# Patient Record
Sex: Female | Born: 1985 | Race: White | Hispanic: No | Marital: Single | Smoking: Never smoker
Health system: Northeastern US, Community
[De-identification: ages and names within clinical notes are randomized; demographics above are authoritative.]

## PROBLEM LIST (undated history)

## (undated) DIAGNOSIS — R7881 Bacteremia: Secondary | ICD-10-CM

## (undated) HISTORY — PX: NO SIGNIFICANT SURGICAL HISTORY: 1000005

## (undated) HISTORY — DX: Bacteremia: R78.81

## (undated) NOTE — Progress Notes (Signed)
 Associated Order(s): Foreign Body Removal - Embedded  Post-Procedure Diagnose(s): Splinter of elbow without major open wound or infection, right, initial encounter  Formatting of this note might be different from the original.  Subjective   Patient ID: Belinda Walker is a 75 y.o. female presenting to the Urgent Care with a chief complaint of Foreign Body in Skin (Splinter in right elbow x2 months- hasn't been able to find a place to remove it like PCP or derm).    HPI  Patient states that she was taking off a jacket with built-in support and she got a splinter/sliver of whatever the supporting material is made out of in her right, inner elbow area. The splinter has not tried to work its way out. She presents for splinter removal.     Objective   BP 141/80 (BP Location: Left arm, Patient Position: Sitting, BP Cuff Size: Adult)   Pulse 78   Temp 36.3 C (97.3 F) (Temporal)   Resp 18   Ht 1.753 m (5' 9)   Wt 56.7 kg (125 lb)   SpO2 99%   BMI 18.46 kg/m     Physical Exam  Constitutional:       General: She is not in acute distress.     Appearance: She is not ill-appearing or toxic-appearing.   Pulmonary:      Effort: Pulmonary effort is normal. No respiratory distress.   Skin:     Comments: Right, inner elbow area:   + superficial splinter visible. No overlying erythema. No bleeding or drainage.   AROM of the RUE. Neurovascular status is intact to the RUE.    Neurological:      Mental Status: She is alert.     Foreign Body Removal - Embedded    Date/Time: 06/17/2024 2:10 PM    Performed by: Janelle Leann Corcoran, CRNP  Authorized by: Janelle Leann Corcoran, CRNP    Consent:     Consent obtained:  Verbal    Consent given by:  Patient    Risks discussed:  Infection, bleeding and pain  Universal protocol:     Procedure explained and questions answered to patient or proxy's satisfaction: yes      Patient identity confirmed:  Verbally with patient  Location:     Location:  Arm    Arm location:  R elbow    Depth:   Intradermal    Tendon involvement:  None  Anesthesia:     Anesthesia method:  Local infiltration    Local anesthetic:  Lidocaine 1% w/o epi  Procedure type:     Procedure complexity:  Simple  Procedure details:     Incision length:  3mm    Removal mechanism:  Forceps    Foreign bodies recovered:  1    Description:  Sliver of metal    Intact foreign body removal: yes    Post-procedure details:     Neurovascular status: intact      Confirmation:  No additional foreign bodies on visualization    Skin closure:  None    Dressing:  Antibiotic ointment and adhesive bandage    Procedure completion:  Tolerated well, no immediate complications    Assessment & Plan    Assessment & Plan  Splinter of elbow without major open wound or infection, right, initial encounter  Successfully removed here today   Other orders    Foreign Body Removal - Embedded    Monitor your splinter removal site for redness, increased pain, swelling, red streaking, or pus-like  drainage. Also monitor for a fever and seek further medical care if any of this presents.     Patient was advised to wash the splinter removal site with soap and water and then to apply otc topical antibiotic and Band Aid for a few days.   Electronically signed by Hart Arvin Lily, CRNP at 06/17/2024  2:03 PM EDT

---

## 2019-09-20 ENCOUNTER — Ambulatory Visit (HOSPITAL_BASED_OUTPATIENT_CLINIC_OR_DEPARTMENT_OTHER): Payer: PRIVATE HEALTH INSURANCE

## 2019-09-20 ENCOUNTER — Other Ambulatory Visit: Payer: Self-pay

## 2019-09-21 ENCOUNTER — Encounter (HOSPITAL_BASED_OUTPATIENT_CLINIC_OR_DEPARTMENT_OTHER): Payer: PRIVATE HEALTH INSURANCE

## 2019-09-27 ENCOUNTER — Encounter (HOSPITAL_BASED_OUTPATIENT_CLINIC_OR_DEPARTMENT_OTHER): Payer: Self-pay | Admitting: Family Medicine

## 2019-09-27 ENCOUNTER — Ambulatory Visit: Payer: PRIVATE HEALTH INSURANCE | Attending: Family Medicine | Admitting: Family Medicine

## 2019-09-27 ENCOUNTER — Other Ambulatory Visit: Payer: Self-pay

## 2019-09-27 DIAGNOSIS — K589 Irritable bowel syndrome without diarrhea: Secondary | ICD-10-CM | POA: Insufficient documentation

## 2019-09-27 DIAGNOSIS — Z0000 Encounter for general adult medical examination without abnormal findings: Secondary | ICD-10-CM | POA: Diagnosis present

## 2019-09-27 DIAGNOSIS — K588 Other irritable bowel syndrome: Secondary | ICD-10-CM | POA: Diagnosis not present

## 2019-09-27 DIAGNOSIS — Z975 Presence of (intrauterine) contraceptive device: Secondary | ICD-10-CM | POA: Diagnosis not present

## 2019-09-27 NOTE — Progress Notes (Signed)
Belinda Walker is a 33 year old female here for routine physical.    Problem List        Unprioritized    Irritable bowel syndrome     09/27/2019: dad has UC, she has a sensitive stomach, she has managed her IBS with diet changes  - stomach virus, then a year of stomach pain "awful pain" - FODMAP diet, garlic and onions affect her, lactose, wheat, with a rotational diet - knows her limits for every food type.          IUD (intrauterine device) in place     09/27/2019: Mirena placed in [place] 2017.                Patient Active Problem List:     IUD (intrauterine device) in place     Irritable bowel syndrome        REVIEW OF SYSTEMS: otherwise feels well, no HA, no CP, no SOB, no cough, no abdominal pain, no diarrhea, no constipation, no fevers, no chills, no significant increase or decreased in weight, no joint pain, no urinary symptoms, no dizziness, no blood in stool    No current outpatient medications on file prior to visit.  No current facility-administered medications on file prior to visit.       Review of Patient's Allergies indicates:  Allergies not on file    Past Medical History:  No date: Bacteremia      Comment:  hospitalized for this (from skin infection scratching                open sore) age 61    Past Surgical History:  No date: NO SIGNIFICANT SURGICAL HISTORY    Review of patient's family history indicates:  Problem: OTHER      Relation: Father          Age of Onset: (Not Specified)          Comment: ulcerative colitis  Problem: Depression      Relation: Mother          Age of Onset: (Not Specified)  Problem: No Known Problems      Relation: Maternal Grandmother          Age of Onset: (Not Specified)  Problem: OTHER      Relation: Maternal Grandfather          Age of Onset: (Not Specified)          Comment: melanoma  Problem: Heart Disease      Relation: Maternal Grandfather          Age of Onset: (Not Specified)  Problem: No Known Problems      Relation: Paternal Grandmother          Age of Onset:  (Not Specified)  Problem: OTHER      Relation: Paternal Grandfather          Age of Onset: (Not Specified)          Comment: prostate cancer      Social History     Socioeconomic History    Marital status: Single     Spouse name: Not on file    Number of children: Not on file    Years of education: Not on file    Highest education level: Not on file   Occupational History    Not on file   Social Needs    Financial resource strain: Not on file    Food insecurity  Worry: Not on file     Inability: Not on file    Transportation needs     Medical: Not on file     Non-medical: Not on file   Tobacco Use    Smoking status: Never Smoker    Smokeless tobacco: Never Used   Substance and Sexual Activity    Alcohol use: Yes     Comment: 1 glass of wine/month    Drug use: Never    Sexual activity: Yes     Partners: Male     Birth control/protection: I.U.D.     Comment: 6 partners   Lifestyle    Physical activity     Days per week: Not on file     Minutes per session: Not on file    Stress: Not on file   Relationships    Social connections     Talks on phone: Not on file     Gets together: Not on file     Attends religious service: Not on file     Active member of club or organization: Not on file     Attends meetings of clubs or organizations: Not on file     Relationship status: Not on file    Intimate partner violence     Fear of current or ex partner: Not on file     Emotionally abused: Not on file     Physically abused: Not on file     Forced sexual activity: Not on file   Other Topics Concern    Not on file   Social History Narrative    09/27/2019: Grew up in [place]. 1 biological brother, 2 half-siblings.    Studied music (intention) but then did biochem, then engineering in grad school, currently unemployed but interviewing for marketing.     Single. Lots of family here, attached to the [employer].     Lives with family, living in [place].          There were no vitals taken for this visit.  Gen- NAD,  appears comfortable, video visit      ASSESSMENT/PLAN:     Belinda Walker is a 33 year old female here for routine physical and to discuss:    Problem List Items Addressed This Visit        Unprioritized    Irritable bowel syndrome    IUD (intrauterine device) in place      Other Visit Diagnoses     Wellness examination    -  Primary    Relevant Orders    HIV ANTIGEN ANTIBODY 5TH GEN    CHLAMYDIA GC NAAT    RPR    LIPID PANEL    CBC WITH PLATELET        - will come in for pap smear      Well woman. Allergies and medications reviewed.   1) preventive care  --cervical cancer screening: UPTD  -- Immunizations: UPTD  - counseled on exercise, nutrition, anxiety/depression, discussed contraception    We discussed medicationsand the importance of medication compliance. The patient was ready to learn and no apparent learning barriers were identified. I explained the diagnosis and treatment plan, and the patient expressed understanding of the content. Possible side effects of the prescribed medication(s) were explained. I attempted to answer any questions regarding the diagnosis and the proposed treatment.      *Patient expressed understanding and agreement with plan  *Follow up prn  *AVS given    Belinda Walker  Belinda Julian, MD

## 2019-10-06 ENCOUNTER — Ambulatory Visit (HOSPITAL_BASED_OUTPATIENT_CLINIC_OR_DEPARTMENT_OTHER): Payer: Self-pay | Admitting: Family Medicine

## 2019-10-11 ENCOUNTER — Ambulatory Visit (HOSPITAL_BASED_OUTPATIENT_CLINIC_OR_DEPARTMENT_OTHER): Payer: Self-pay | Admitting: Physician Assistant

## 2021-12-26 IMAGING — MR MRI ANKLE RT WO CONTRAST
8 of 9 series · 39 of 40 positions shown · non-contrast
Comparison: none

﻿

Pertinent Hx:  Pain.  Tarsal tunnel mass.
TECHNIQUE: Images were taken in axial, coronal and sagittal planes.  T1 and T2-weighted imaging was performed.

[Series 3: PD · axial · 4.0mm · 0.62mm/px · z∈[-98,+87]mm · 7 of 38 slices shown]
[im 1/38]
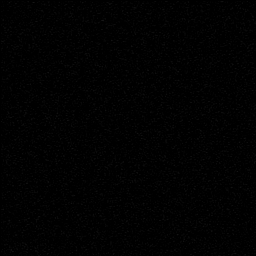
[im 7/38]
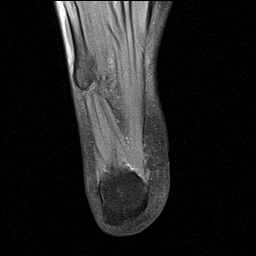
[im 13/38]
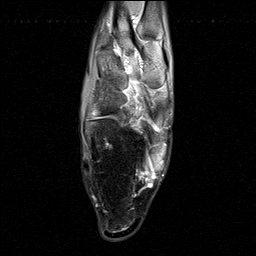
[im 19/38]
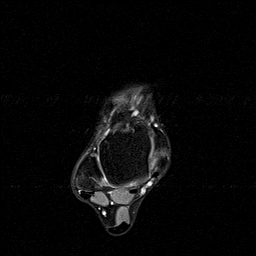
[im 25/38]
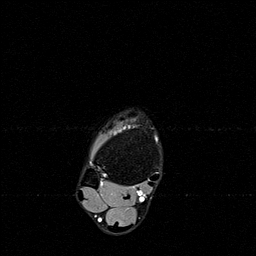
[im 31/38]
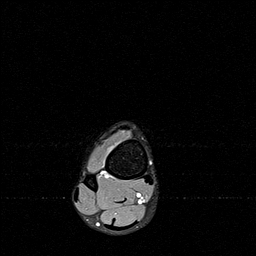
[im 38/38]
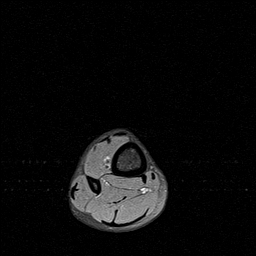

[Series 4: T2 · axial · 4.0mm · 0.62mm/px · z∈[-97,+88]mm · 7 of 38 slices shown (1 of 3)]
[im 1/38]
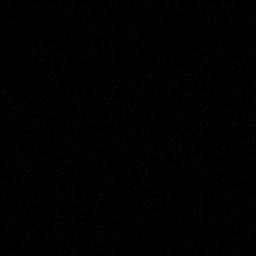
[im 7/38]
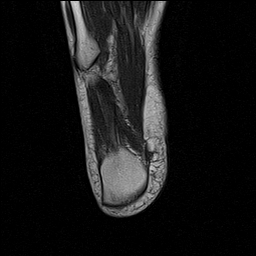
[im 13/38]
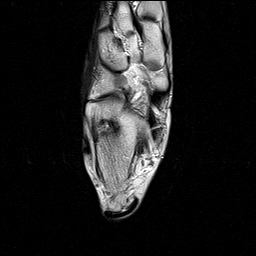
[im 19/38]
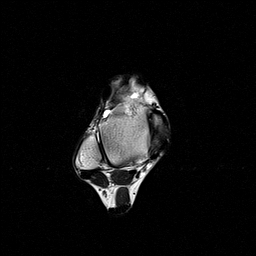
[im 25/38]
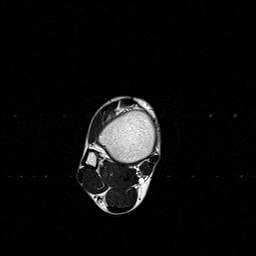
[im 31/38]
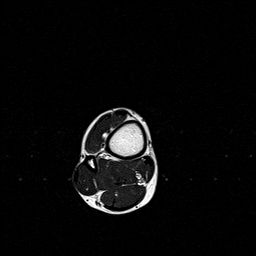
[im 38/38]
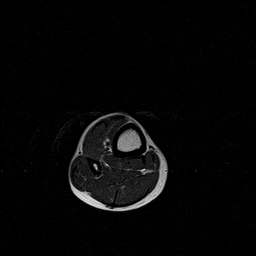

[Series 5: T1 · axial · 4.0mm · 0.62mm/px · z∈[-98,+87]mm · 7 of 38 slices shown (1 of 2)]
[im 1/38]
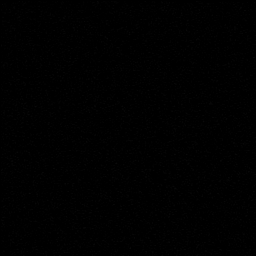
[im 7/38]
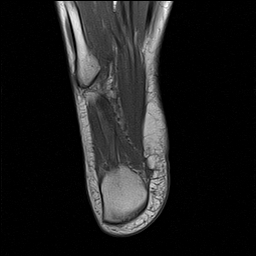
[im 13/38]
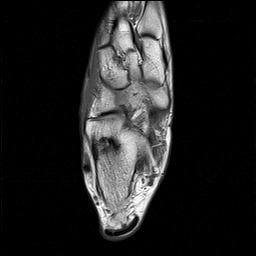
[im 19/38]
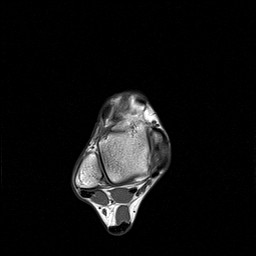
[im 25/38]
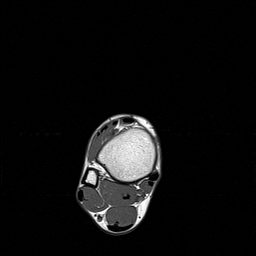
[im 31/38]
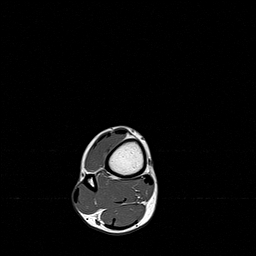
[im 38/38]
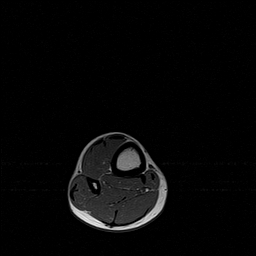

[Series 6: PD fat-sat · sagittal · 3.0mm · 0.50mm/px · 4 of 21 slices shown (1 of 2)]
[im 1/21]
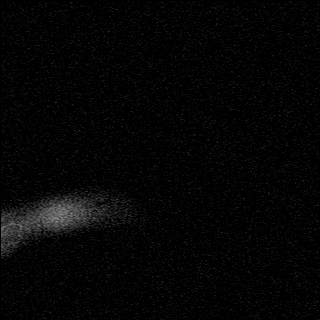
[im 7/21]
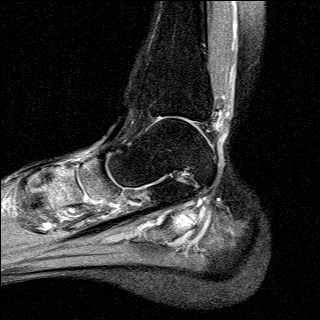
[im 14/21]
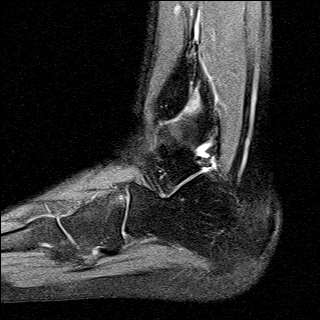
[im 21/21]
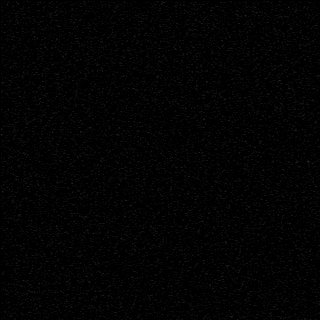

[Series 7: T2 · sagittal · 3.0mm · 0.50mm/px · 4 of 21 slices shown (2 of 3)]
[im 1/21]
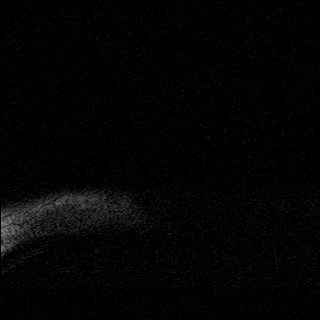
[im 7/21]
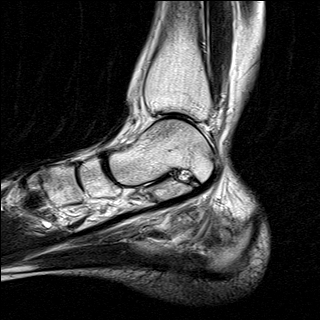
[im 14/21]
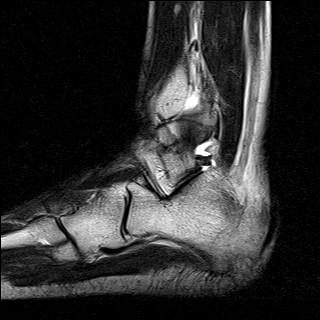
[im 21/21]
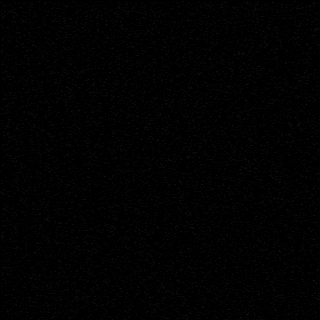

[Series 8: PD fat-sat · coronal · 4.0mm · 0.50mm/px · 4 of 24 slices shown (2 of 2)]
[im 1/24]
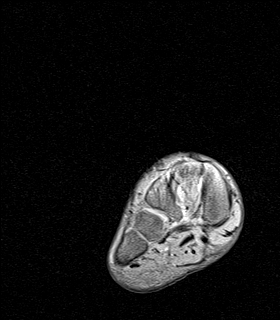
[im 8/24]
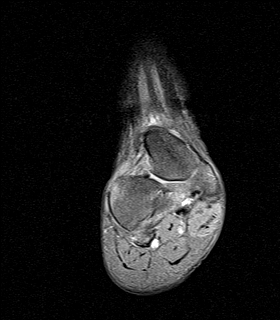
[im 16/24]
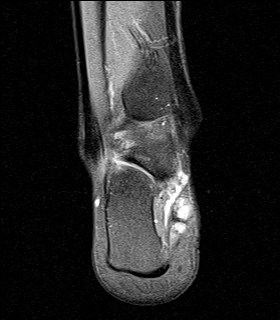
[im 24/24]
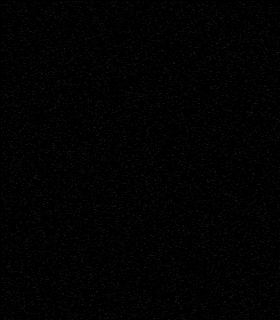

[Series 10: T2 · coronal · 4.0mm · 0.62mm/px · 4 of 24 slices shown (3 of 3)]
[im 1/24]
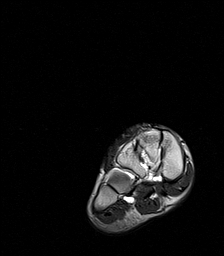
[im 8/24]
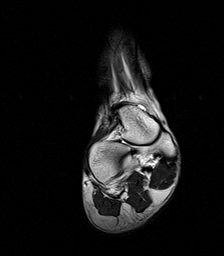
[im 16/24]
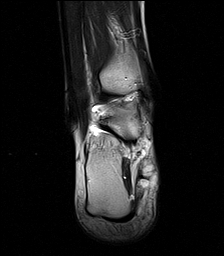
[im 24/24]
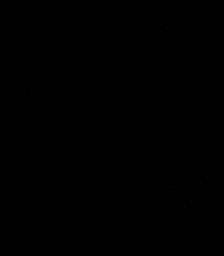

[Series 11: T1 · sagittal · 3.0mm · 0.62mm/px · 2 of 13 slices shown (2 of 2)]
[im 1/13]
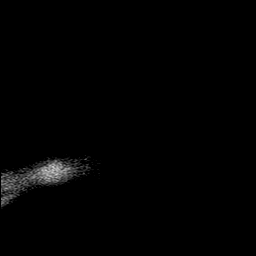
[im 13/13]
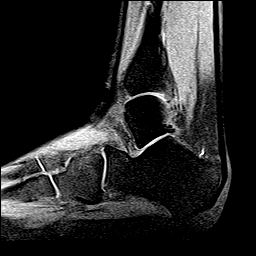

[39 of 40 positions shown; findings below may reference images not displayed]

FINDINGS: There is a mass to the medial side of the calcaneus in the tarsal tunnel.  The mass measures 1.8 x 1.8 x 2 cm.  signal intensity suggests that this is likely fluid-filled although there are a few areas within the mass that are not clearly cystic and fluid.  Overall, this is likely a ganglion cyst.  However, given that there may be some solid tissue present within this, other etiologies and particularly a neurogenic tumor such as schwannoma cannot be excluded as a possibility.  Further imaging will be recommended to narrow the differential diagnosis.  

Bones appear normal.  No evidence of stress injury or stress reaction.  There is no fracture.  

No ligament tear identified.  The anterior talofibular, fibulocalcaneal, and posterior talofibular ligaments are normal.  The deltoid ligament is normal.  The syndesmosis is normal.  Syndesmotic ligaments are normal.  

The Achilles tendon and plantar fascia are normal.

There is normal alignment between the hindfoot and the midfoot and between the midfoot and the forefoot.  

The Lisfranc ligament is normal.  

No tendon tear identified.  Specifically, the posterior tibial tendon appears normal and not torn. 

No other abnormality identified.
IMPRESSION: 1. Mass within the tarsal tunnel measuring 1.8 x 1.8 x 2 cm in size.  This is probably a ganglion cyst although there are some potentially solid areas within this.  Therefore, other etiologies cannot be entirely excluded as a possibility.  Repeat imaging of the ankle with intravenous contrast enhancement is recommended to assess further and to help narrow the differential diagnosis.

2. No other abnormality identified.

## 2022-01-11 IMAGING — MR MRI ANKLE RT W WO CONTRAST
9 of 11 series · 35 of 40 positions shown · IV contrast (prohance)
Comparison: none

﻿Pertinent Hx:  Previously seen mass in the tarsal tunnel.  Evaluate whether this is cystic or solid.
TECHNIQUE: Images were taken in axial, coronal and sagittal planes.  T1 and T2-weighted imaging was performed.  Intravenous contrast material was given, 10 mL Prohance.  Enhanced images were taken in all three planes.

[Series 3: PD · axial · 4.0mm · 0.62mm/px · z∈[-123,+61]mm · 5 of 38 slices shown]
[im 1/38]
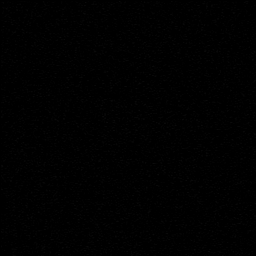
[im 10/38]
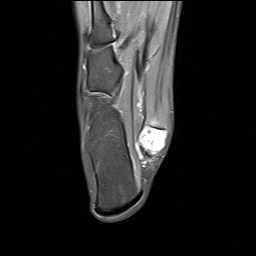
[im 19/38]
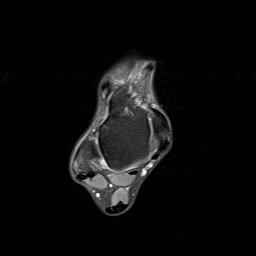
[im 28/38]
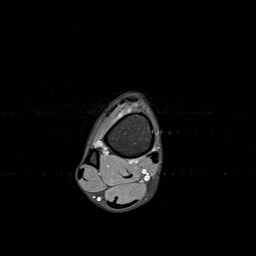
[im 38/38]
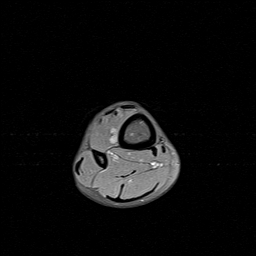

[Series 4: T2 · axial · 4.0mm · 0.50mm/px · z∈[-123,+61]mm · 5 of 38 slices shown (1 of 2)]
[im 1/38]
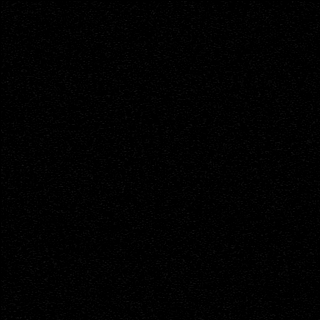
[im 10/38]
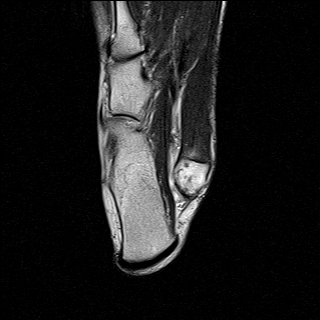
[im 19/38]
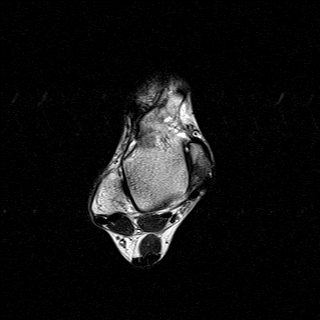
[im 28/38]
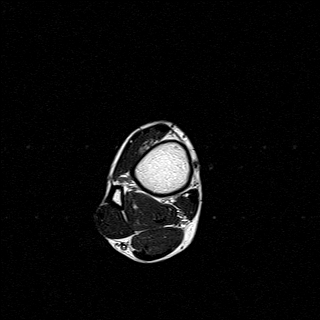
[im 38/38]
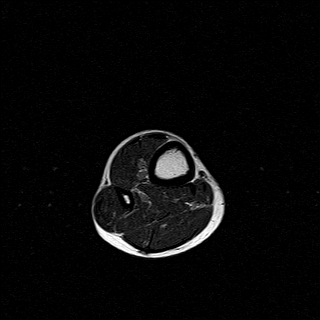

[Series 5: T1 · axial · 4.0mm · 0.62mm/px · z∈[-123,+61]mm · 5 of 38 slices shown]
[im 1/38]
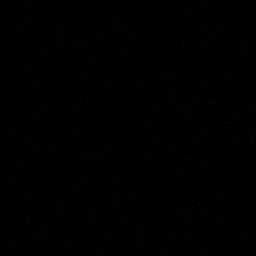
[im 10/38]
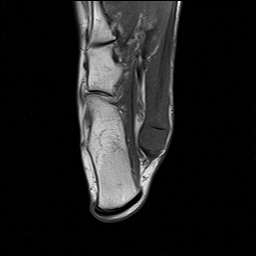
[im 19/38]
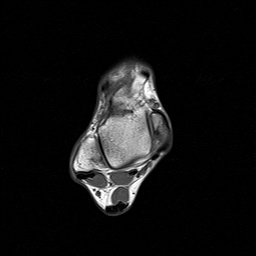
[im 28/38]
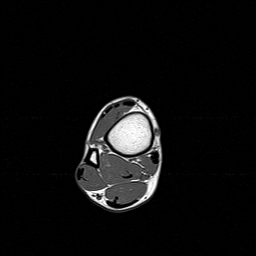
[im 38/38]
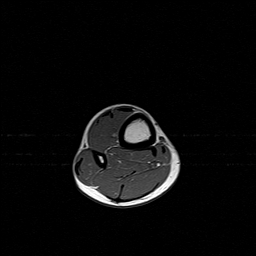

[Series 6: PD fat-sat · sagittal · 3.0mm · 0.50mm/px · 3 of 21 slices shown (1 of 2)]
[im 1/21]
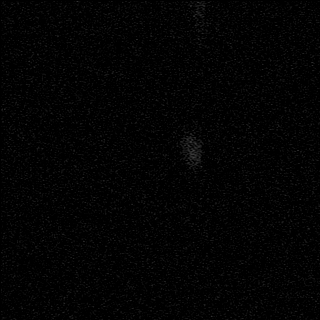
[im 11/21]
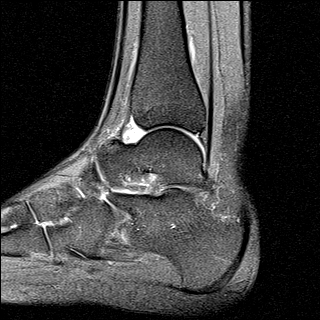
[im 21/21]
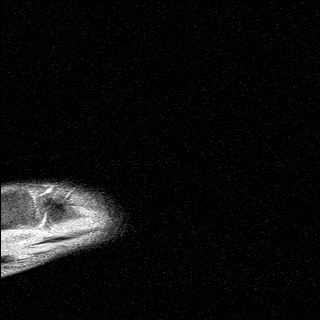

[Series 7: T2 · sagittal · 3.0mm · 0.50mm/px · 3 of 21 slices shown (2 of 2)]
[im 1/21]
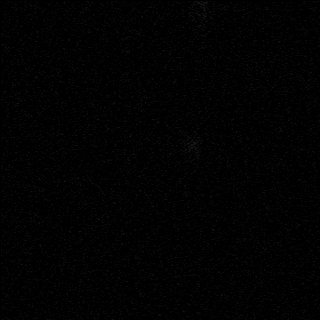
[im 11/21]
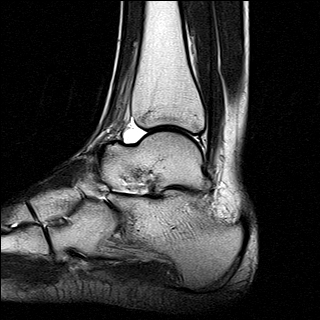
[im 21/21]
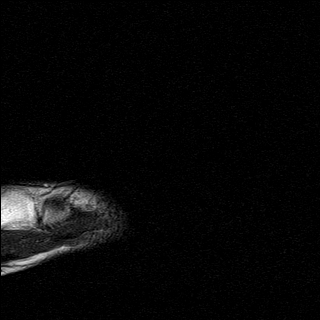

[Series 8: PD fat-sat · coronal · 4.0mm · 0.50mm/px · 3 of 24 slices shown (2 of 2)]
[im 1/24]
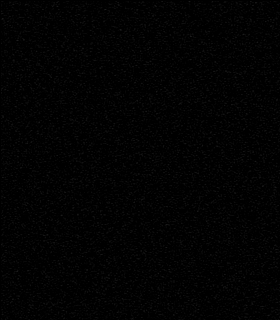
[im 12/24]
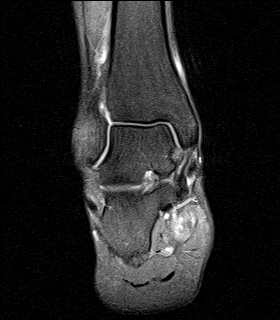
[im 24/24]
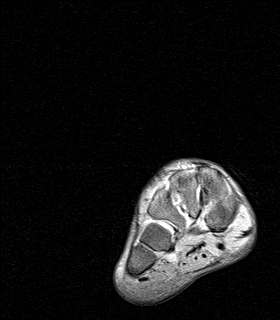

[Series 10: T1 fat-sat post-contrast · coronal · 4.0mm · 0.31mm/px · 3 of 24 slices shown (1 of 3)]
[im 1/24]
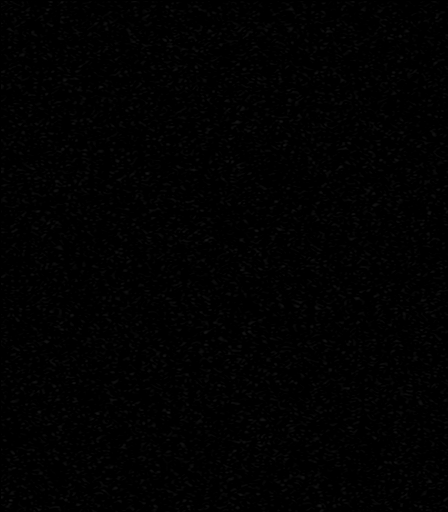
[im 12/24]
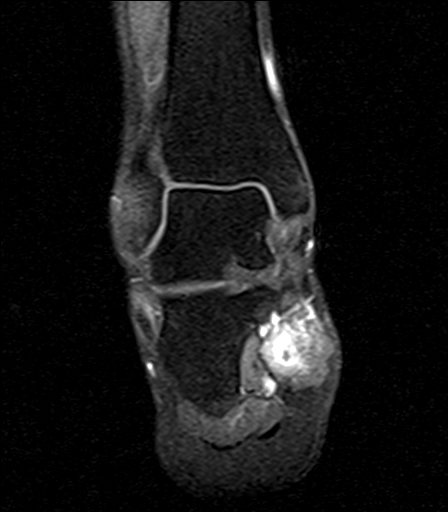
[im 24/24]
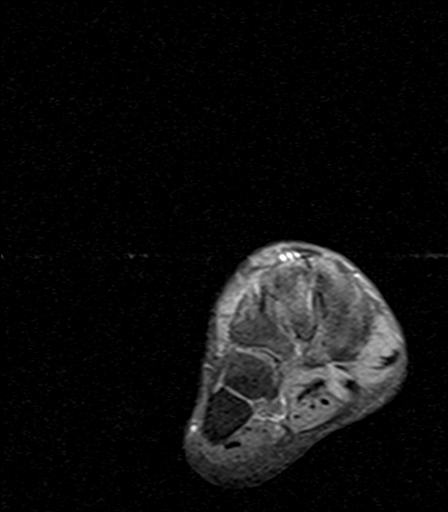

[Series 11: T1 fat-sat post-contrast · axial · 4.0mm · 0.31mm/px · z∈[-123,+61]mm · 5 of 38 slices shown (2 of 3)]
[im 1/38]
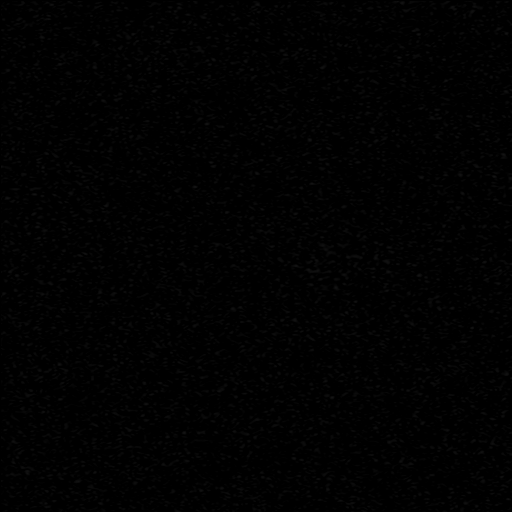
[im 10/38]
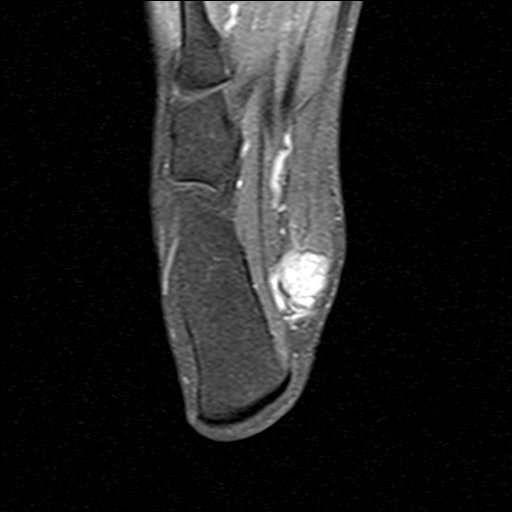
[im 19/38]
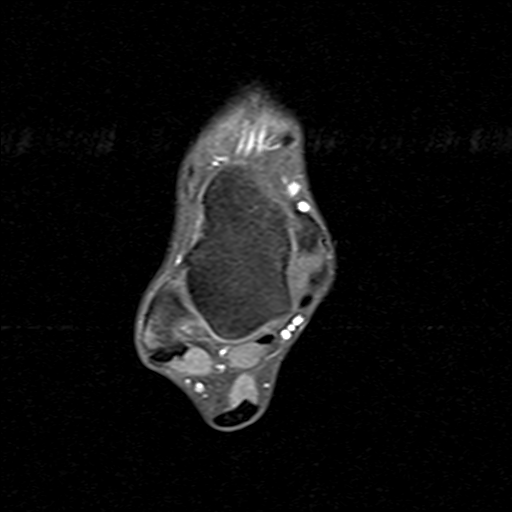
[im 28/38]
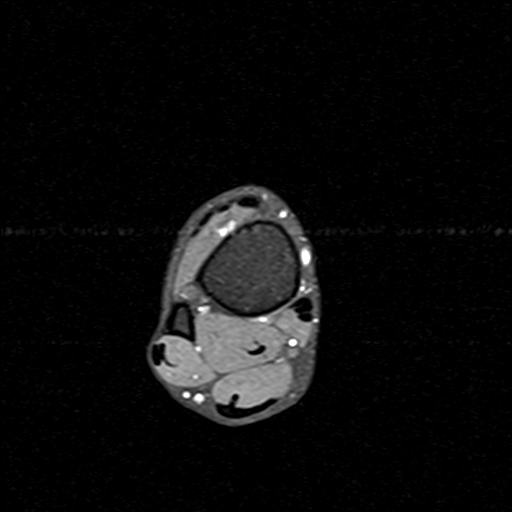
[im 38/38]
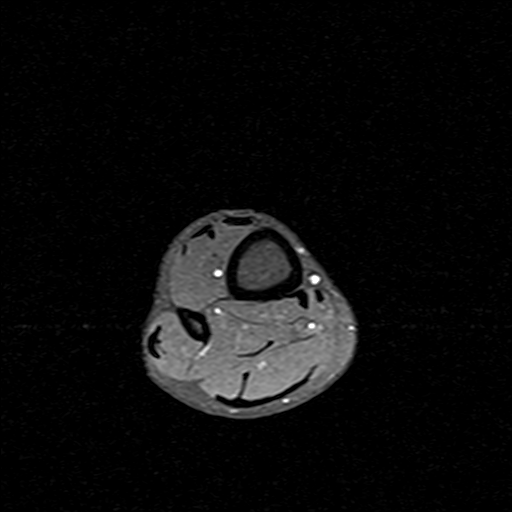

[Series 12: T1 fat-sat post-contrast · sagittal · 3.0mm · 0.31mm/px · 3 of 21 slices shown (3 of 3)]
[im 1/21]
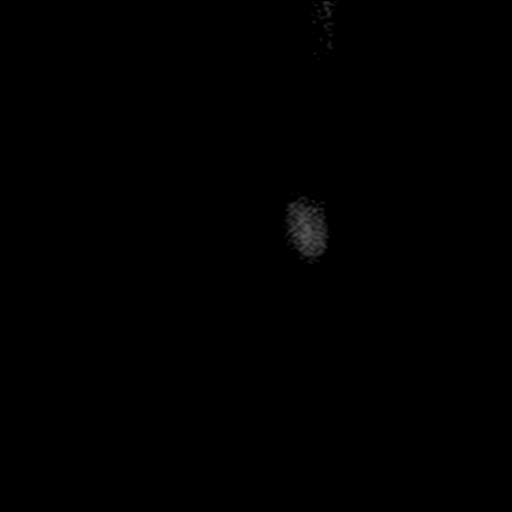
[im 11/21]
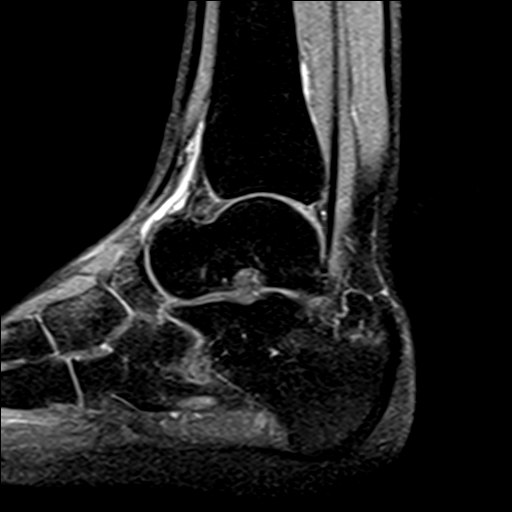
[im 21/21]
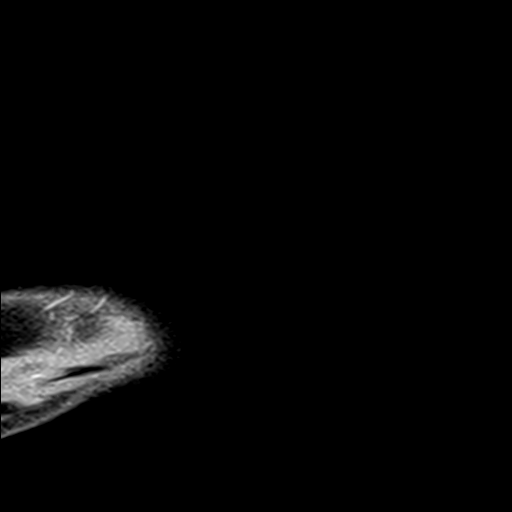

[35 of 40 positions shown; findings below may reference images not displayed]

FINDINGS: The previously noted mass medial to the calcaneus within the tarsal tunnel is solid.  It shows intense contrast enhancement.  There are some minor areas within this that do not enhance.  Overall, this appears to be a solid tumor.

Given that this appears solid, this is clearly not a ganglion cyst.  Malignancy is a primary consideration.  

No new abnormality otherwise identified.  Other structures remain normal.
IMPRESSION: 1. The mass medial to the calcaneus within the tarsal tunnel is solid and not cystic.  It does not appear to be a ganglion cyst but is a solid tumor.  Malignancy is, therefore, a strong consideration.  Recommend biopsy-excision to establish a diagnosis.  

2. No other abnormality identified.
